# Patient Record
Sex: Female | Born: 2005 | Race: White | Hispanic: No | Marital: Single | Smoking: Never smoker
Health system: Southern US, Community
[De-identification: ages and names within clinical notes are randomized; demographics above are authoritative.]

---
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
## 2024-03-05 ENCOUNTER — Encounter: Payer: Self-pay | Admitting: Physician Assistant

## 2024-03-05 ENCOUNTER — Ambulatory Visit: Admitting: Physician Assistant

## 2024-03-05 VITALS — BP 125/74 | HR 92 | Ht 65.0 in | Wt 153.2 lb

## 2024-03-05 DIAGNOSIS — Z30432 Encounter for removal of intrauterine contraceptive device: Secondary | ICD-10-CM

## 2024-03-05 DIAGNOSIS — Z3009 Encounter for other general counseling and advice on contraception: Secondary | ICD-10-CM

## 2024-03-05 MED ORDER — NORELGESTROMIN-ETH ESTRADIOL 150-35 MCG/24HR TD PTWK: 1.0000 | MEDICATED_PATCH | TRANSDERMAL | 3 refills | Status: AC

## 2024-03-05 NOTE — Progress Notes (Signed)
    GYNECOLOGY OFFICE PROCEDURE NOTE  Lori Knapp is a 18 y.o. G0P0000 here for Mirena  IUD removal. No GYN concerns.  Pap smear not indicated due to age.   IUD Removal  Patient identified, informed consent performed, consent signed.  Patient was in the dorsal lithotomy position, normal external genitalia was noted.  A speculum was placed in the patient's vagina, normal discharge was noted, no lesions. The cervix was visualized, no lesions, no abnormal discharge.  The strings of the IUD were grasped and pulled using ring forceps. The IUD was removed in its entirety.   Patient tolerated the procedure well.    Patient will use transdermal patches for contraception.  Routine preventative health maintenance measures emphasized.   Jorene Moats, PA-C Center for [employer], [facility]

## 2024-03-05 NOTE — Progress Notes (Signed)
 IUD removal; due to migraines and vaginal dryness.  Interested in pills or patches.

## 2024-03-07 ENCOUNTER — Encounter (HOSPITAL_COMMUNITY): Payer: Self-pay

## 2024-03-07 ENCOUNTER — Ambulatory Visit (HOSPITAL_COMMUNITY): Admission: EM | Admit: 2024-03-07 | Discharge: 2024-03-07 | Disposition: A | Discharge status: Home / Self Care

## 2024-03-07 DIAGNOSIS — B349 Viral infection, unspecified: Secondary | ICD-10-CM

## 2024-03-07 MED ORDER — AZELASTINE HCL 0.1 % NA SOLN: 1.0000 | Freq: Two times a day (BID) | NASAL | 0 refills | Status: AC

## 2024-03-07 MED ORDER — PROMETHAZINE-DM 6.25-15 MG/5ML PO SYRP: 10.0000 mL | ORAL_SOLUTION | Freq: Three times a day (TID) | ORAL | 0 refills | Status: AC | PRN

## 2024-03-07 NOTE — Discharge Instructions (Signed)
  1. Acute viral syndrome (Primary) - azelastine  (ASTELIN ) 0.1 % nasal spray; Place 1 spray into both nostrils 2 (two) times daily. Use in each nostril as directed  Dispense: 30 mL; Refill: 0 - promethazine -dextromethorphan (PROMETHAZINE -DM) 6.25-15 MG/5ML syrup; Take 10 mLs by mouth 3 (three) times daily as needed for cough.  Dispense: 240 mL; Refill: 0  -Continue to monitor symptoms for any change in severity if there is any escalation of current symptoms or development of new symptoms follow-up in ER for further evaluation and management.

## 2024-03-07 NOTE — ED Provider Notes (Signed)
 [facility] [place]  Note:  This document was prepared using Conservation officer, historic buildings and may include unintentional dictation errors.  MRN: [id number] DOB: 2006-02-15  Subjective:   MARYELLA ABOOD is a 18 y.o. female presenting for cough, chest congestion, sore throat x 4 to 5 days.  Patient reports she has been taking Sudafed and over-the-counter nasal spray with minimal improvement to symptoms.  Patient denies any known sick contacts.  No shortness of breath, chest pain, weakness, dizziness, fever.  No current facility-administered medications for this encounter.  Current Outpatient Medications:    azelastine  (ASTELIN ) 0.1 % nasal spray, Place 1 spray into both nostrils 2 (two) times daily. Use in each nostril as directed, Disp: 30 mL, Rfl: 0   cetirizine (ZYRTEC) 10 MG tablet, Take 1 tablet by mouth daily., Disp: , Rfl:    norelgestromin -ethinyl estradiol  (XULANE) 150-35 MCG/24HR transdermal patch, Place 1 patch onto the skin once a week., Disp: 12 patch, Rfl: 3   promethazine -dextromethorphan (PROMETHAZINE -DM) 6.25-15 MG/5ML syrup, Take 10 mLs by mouth 3 (three) times daily as needed for cough., Disp: 240 mL, Rfl: 0   estradiol  (ESTRACE ) 0.1 MG/GM vaginal cream, Apply 1 gram per vagina every night for 2 weeks, then apply twice a week, Disp: 30 g, Rfl: 12   No Known Allergies  History reviewed. No pertinent past medical history.   History reviewed. No pertinent surgical history.  Family History  Problem Relation Age of Onset   Hypertension Paternal Grandmother    Liver disease Maternal Grandmother    Hypertension Maternal Grandfather    Hypertension Father     Social History   Tobacco Use   Smoking status: Never    Passive exposure: Never   Smokeless tobacco: Never  Vaping Use   Vaping status: Never Used  Substance Use Topics   Alcohol use: Never    Comment: occ   Drug use: Never    ROS Refer to HPI for ROS details.  Objective:    Vitals: BP  123/81 (BP Location: Left Arm)   Pulse 80   Temp 98.2 F (36.8 C) (Oral)   Resp 19   Ht 5' 5 (1.651 m)   Wt 153 lb (69.4 kg)   LMP 02/14/2024 (Exact Date)   SpO2 98%   BMI 25.46 kg/m   Physical Exam Vitals and nursing note reviewed.  Constitutional:      General: She is not in acute distress.    Appearance: Normal appearance. She is well-developed. She is not ill-appearing or toxic-appearing.  HENT:     Head: Normocephalic and atraumatic.     Nose: Congestion present.     Mouth/Throat:     Mouth: Mucous membranes are moist.     Pharynx: Oropharynx is clear. No posterior oropharyngeal erythema.  Cardiovascular:     Rate and Rhythm: Normal rate.  Pulmonary:     Effort: Pulmonary effort is normal. No respiratory distress.     Breath sounds: No stridor. No wheezing, rhonchi or rales.  Chest:     Chest wall: No tenderness.  Skin:    General: Skin is warm and dry.  Neurological:     General: No focal deficit present.     Mental Status: She is alert and oriented to person, place, and time.  Psychiatric:        Mood and Affect: Mood normal.        Behavior: Behavior normal.     Procedures  No results found for this or any previous visit (  from the past 24 hours).  Assessment and Plan :     Discharge Instructions       1. Acute viral syndrome (Primary) - azelastine  (ASTELIN ) 0.1 % nasal spray; Place 1 spray into both nostrils 2 (two) times daily. Use in each nostril as directed  Dispense: 30 mL; Refill: 0 - promethazine -dextromethorphan (PROMETHAZINE -DM) 6.25-15 MG/5ML syrup; Take 10 mLs by mouth 3 (three) times daily as needed for cough.  Dispense: 240 mL; Refill: 0  -Continue to monitor symptoms for any change in severity if there is any escalation of current symptoms or development of new symptoms follow-up in ER for further evaluation and management.      Wiley Magan B Jannifer Fischler   Maday Guarino, [place], [place] 03/07/24 1332

## 2024-03-07 NOTE — ED Triage Notes (Signed)
 Pt states that she has a cough, chest congestion and sore throat. X5 days  Pt states that she has been taking Sudafed otc and nasal spray.

## 2024-03-17 ENCOUNTER — Encounter: Payer: Self-pay | Admitting: Physician Assistant
